# Patient Record
Sex: Male | Born: 2000 | Race: Black or African American | Hispanic: No | Marital: Single | Smoking: Never smoker
Health system: Southern US, Community
[De-identification: ages and names within clinical notes are randomized; demographics above are authoritative.]

## PROBLEM LIST (undated history)

## (undated) DIAGNOSIS — Z789 Other specified health status: Secondary | ICD-10-CM

## (undated) DIAGNOSIS — J302 Other seasonal allergic rhinitis: Secondary | ICD-10-CM

---
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
## 2021-05-25 ENCOUNTER — Encounter (HOSPITAL_COMMUNITY): Payer: Self-pay

## 2021-05-25 ENCOUNTER — Emergency Department (HOSPITAL_COMMUNITY)
Admission: EM | Admit: 2021-05-25 | Discharge: 2021-05-25 | Disposition: A | Discharge status: Home / Self Care | Payer: No Typology Code available for payment source | Attending: Emergency Medicine | Admitting: Emergency Medicine

## 2021-05-25 ENCOUNTER — Other Ambulatory Visit: Payer: Self-pay

## 2021-05-25 DIAGNOSIS — S60512A Abrasion of left hand, initial encounter: Secondary | ICD-10-CM | POA: Diagnosis not present

## 2021-05-25 DIAGNOSIS — S6992XA Unspecified injury of left wrist, hand and finger(s), initial encounter: Secondary | ICD-10-CM | POA: Diagnosis present

## 2021-05-25 DIAGNOSIS — Y92410 Unspecified street and highway as the place of occurrence of the external cause: Secondary | ICD-10-CM | POA: Insufficient documentation

## 2021-05-25 MED ORDER — IBUPROFEN 400 MG PO TABS
400.0000 mg | ORAL_TABLET | Freq: Once | ORAL | Status: AC
  Administered 2021-05-25: 400 mg via ORAL
  Filled 2021-05-25: qty 1
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
## 2021-05-25 NOTE — ED Notes (Signed)
Discharge instructions reviewed. Confirmed understanding. Wound cleaning education provided
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
## 2021-05-25 NOTE — Discharge Instructions (Addendum)
Please take ibuprofen 600 mg every 6 hours as needed for pain. Please do not overdo your activity and be sure to drink plenty of fluids. You may feel more sore tomorrow than today. You may apply neosporin or another topical antibiotic ointment to your hand.
